# Patient Record
Sex: Male | Born: 1985 | Race: Black or African American | Hispanic: No | Marital: Single | Smoking: Current every day smoker
Health system: Southern US, Community
[De-identification: ages and names within clinical notes are randomized; demographics above are authoritative.]

---

## 2020-07-15 ENCOUNTER — Other Ambulatory Visit: Payer: Self-pay

## 2020-07-15 ENCOUNTER — Emergency Department (HOSPITAL_COMMUNITY)
Admission: EM | Admit: 2020-07-15 | Discharge: 2020-07-16 | Disposition: A | Discharge status: Home / Self Care | Payer: Worker's Compensation | Attending: Emergency Medicine | Admitting: Emergency Medicine

## 2020-07-15 ENCOUNTER — Emergency Department (HOSPITAL_COMMUNITY): Payer: Self-pay

## 2020-07-15 DIAGNOSIS — W010XXA Fall on same level from slipping, tripping and stumbling without subsequent striking against object, initial encounter: Secondary | ICD-10-CM | POA: Diagnosis not present

## 2020-07-15 DIAGNOSIS — M25521 Pain in right elbow: Secondary | ICD-10-CM | POA: Diagnosis not present

## 2020-07-15 DIAGNOSIS — M79604 Pain in right leg: Secondary | ICD-10-CM

## 2020-07-15 DIAGNOSIS — S92415A Nondisplaced fracture of proximal phalanx of left great toe, initial encounter for closed fracture: Secondary | ICD-10-CM | POA: Diagnosis not present

## 2020-07-15 DIAGNOSIS — S7011XA Contusion of right thigh, initial encounter: Secondary | ICD-10-CM | POA: Diagnosis not present

## 2020-07-15 DIAGNOSIS — W19XXXA Unspecified fall, initial encounter: Secondary | ICD-10-CM

## 2020-07-15 DIAGNOSIS — S99922A Unspecified injury of left foot, initial encounter: Secondary | ICD-10-CM | POA: Diagnosis present

## 2020-07-15 MED ORDER — CYCLOBENZAPRINE HCL 10 MG PO TABS
10.0000 mg | ORAL_TABLET | Freq: Once | ORAL | Status: AC
  Administered 2020-07-16: 10 mg via ORAL
  Filled 2020-07-15: qty 1

## 2020-07-15 MED ORDER — KETOROLAC TROMETHAMINE 60 MG/2ML IM SOLN
60.0000 mg | Freq: Once | INTRAMUSCULAR | Status: AC
  Administered 2020-07-16: 60 mg via INTRAMUSCULAR
  Filled 2020-07-15: qty 2
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
## 2020-07-15 NOTE — ED Notes (Signed)
Pt to xray

## 2020-07-16 ENCOUNTER — Emergency Department (HOSPITAL_COMMUNITY): Payer: Worker's Compensation

## 2020-07-16 MED ORDER — HYDROCODONE-ACETAMINOPHEN 5-325 MG PO TABS: 1.0000 | ORAL_TABLET | Freq: Four times a day (QID) | ORAL | 0 refills | Status: AC | PRN

## 2020-07-16 MED ORDER — CYCLOBENZAPRINE HCL 10 MG PO TABS: 10.0000 mg | ORAL_TABLET | Freq: Two times a day (BID) | ORAL | 0 refills | Status: AC | PRN
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
## 2020-07-16 NOTE — ED Notes (Signed)
This RN called the ortho tech for a CAM boot and crutches

## 2020-07-16 NOTE — ED Notes (Signed)
Patient verbalizes understanding of discharge instructions. Opportunity for questioning and answers were provided. Armband removed by staff, pt discharged from ED via wheelchair.  

## 2020-07-16 NOTE — Progress Notes (Signed)
Orthopedic Tech Progress Note Patient Details:  Mike Hood Mar 15, 1986 [id number]    Post Interventions Patient Tolerated: Well Instructions Provided: Adjustment of device,Care of device,Poper ambulation with device    Ortho Devices Type of Ortho Device: Crutches,CAM walker Ortho Device/Splint Location: Left Lower Extremity Ortho Device/Splint Interventions: Ordered,Application,Adjustment   Post Interventions Patient Tolerated: Well Instructions Provided: Adjustment of device,Care of device,Poper ambulation with device   [name] [name] 07/16/2020, 1:22 AM

## 2020-07-16 NOTE — ED Notes (Signed)
Pt back from x-ray.

## 2020-08-05 ENCOUNTER — Ambulatory Visit: Payer: Self-pay

## 2020-08-05 ENCOUNTER — Other Ambulatory Visit: Payer: Self-pay

## 2020-08-05 ENCOUNTER — Encounter: Payer: Self-pay | Admitting: Orthopaedic Surgery

## 2020-08-05 ENCOUNTER — Ambulatory Visit (INDEPENDENT_AMBULATORY_CARE_PROVIDER_SITE_OTHER): Payer: Worker's Compensation | Admitting: Orthopaedic Surgery

## 2020-08-05 DIAGNOSIS — S92412A Displaced fracture of proximal phalanx of left great toe, initial encounter for closed fracture: Secondary | ICD-10-CM

## 2020-08-05 DIAGNOSIS — M79675 Pain in left toe(s): Secondary | ICD-10-CM

## 2020-08-05 DIAGNOSIS — S92912A Unspecified fracture of left toe(s), initial encounter for closed fracture: Secondary | ICD-10-CM | POA: Insufficient documentation
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
## 2020-08-15 ENCOUNTER — Encounter: Payer: Self-pay | Admitting: Orthopaedic Surgery

## 2020-08-15 ENCOUNTER — Ambulatory Visit (INDEPENDENT_AMBULATORY_CARE_PROVIDER_SITE_OTHER): Payer: Worker's Compensation | Admitting: Orthopaedic Surgery

## 2020-08-15 VITALS — BP 151/96 | HR 83 | Ht 69.0 in | Wt 270.0 lb

## 2020-08-15 DIAGNOSIS — S92412A Displaced fracture of proximal phalanx of left great toe, initial encounter for closed fracture: Secondary | ICD-10-CM

## 2020-08-15 NOTE — Progress Notes (Signed)
Office Visit Note   Patient: Mike Hood           Date of Birth: 05-14-1986           MRN: [id number] Visit Date: 08/15/2020              Requested by: No referring provider defined for this encounter. PCP: Pcp, No   Assessment & Plan: Visit Diagnoses:  1. Closed displaced fracture of proximal phalanx of left great toe, initial encounter     Plan: Work note starting work Monday, 08/18/2020 with full days which are normal 10-hour shift.  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  Due to his toe fracture he is not able to ambulate at normal speed to do normal number of deliveries.  I plan to recheck him in 4 weeks.  Impairment rating would be in line with his intra-articular toe fracture to the great toe using [employer] act guidelines.  Follow-Up Instructions: Return in about 4 weeks (around 09/12/2020).   Orders:  No orders of the defined types were placed in this encounter.  No orders of the defined types were placed in this encounter.     Procedures: No procedures performed   Clinical Data: No additional findings.   Subjective: Chief Complaint  Patient presents with   Left Foot - Fracture, Follow-up    OTJI 07/17/2020    HPI 35 year old male returns for on-the-job injury toe fracture intra-articular 07/17/2020.  He is out of boot and into a tennis shoe that has a more rigid sole.  He is amatory without limp and work slip given for work resumption on 08/18/2020.    Review of Systems systems updated unchanged.   Objective: Vital Signs: BP (!) 151/96    Pulse 83    Ht 5\' 9"  (1.753 m)    Wt 270 lb (122.5 kg)    BMI 39.87 kg/m   Physical Exam Constitutional:      Appearance: He is well-developed and well-nourished.  HENT:     Head: Normocephalic and atraumatic.  Eyes:     Extraocular Movements:  EOM normal.     Pupils: Pupils are equal, round, and reactive to light.  Neck:     Thyroid: No thyromegaly.     Trachea: No tracheal deviation.  Cardiovascular:     Rate and Rhythm: Normal rate.  Pulmonary:     Effort: Pulmonary effort is normal.     Breath sounds: No wheezing.  Abdominal:     General: Bowel sounds are normal.     Palpations: Abdomen is soft.  Skin:    General: Skin is warm and dry.     Capillary Refill: Capillary refill takes less than 2 seconds.  Neurological:     Mental Status: He is alert and oriented to person, place, and time.  Psychiatric:        Mood and Affect: Mood and affect normal.        Behavior: Behavior normal.        Thought Content: Thought content normal.        Judgment: Judgment normal.     Ortho Exam patient is ambulatory without a limp.  Normal stride length.  Good dorsiflexion of the toe.  Increased pain with IP flexion. Specialty Comments:  No specialty comments available.  Imaging: No results  found.   PMFS History: Patient Active Problem List   Diagnosis Date Noted   Toe fracture, left 08/05/2020   No past medical history on file.  No family history on file.   Social History   Occupational History   Not on file  Tobacco Use   Smoking status: Current Every Day Smoker   Smokeless tobacco: Never Used  Substance and Sexual Activity   Alcohol use: Not Currently   Drug use: Not on file   Sexual activity: Not on file

## 2020-09-12 ENCOUNTER — Ambulatory Visit (INDEPENDENT_AMBULATORY_CARE_PROVIDER_SITE_OTHER): Payer: Worker's Compensation | Admitting: Orthopaedic Surgery

## 2020-09-12 ENCOUNTER — Encounter: Payer: Self-pay | Admitting: Orthopaedic Surgery

## 2020-09-12 VITALS — BP 114/74 | HR 84 | Ht 69.0 in | Wt 270.0 lb

## 2020-09-12 DIAGNOSIS — S92412D Displaced fracture of proximal phalanx of left great toe, subsequent encounter for fracture with routine healing: Secondary | ICD-10-CM | POA: Diagnosis not present

## 2020-09-12 NOTE — Progress Notes (Signed)
Office Visit Note   Patient: Mike Hood           Date of Birth: [birth year]           MRN: [id number] Visit Date: 09/12/2020              Requested by: No referring provider defined for this encounter. PCP: Pcp, No   Assessment & Plan: Visit Diagnoses: No diagnosis found.  Plan: Continue previously outlined plan for gradual resumption of full duty.  I will see him in 1 month and will assign him an impairment rating.  Preliminary impairment for the intra-articular great toe fracture involving the proximal phalanx would be 10% of the left great toe.  He will return in 1 month for final impairment rating.  Follow-Up Instructions: No follow-ups on file.   Orders:  No orders of the defined types were placed in this encounter.  No orders of the defined types were placed in this encounter.     Procedures: No procedures performed   Clinical Data: No additional findings.   Subjective: Chief Complaint  Patient presents with  . Left Foot - Follow-up, Fracture    OTJI 07/17/2020    HPI 35 year old male returns for follow-up of left great toe fracture.  4-week follow-up.  Patient resume work on 08/18/2020 half days and is gradually ramping up as previously outlined in his note when I saw him on 08/15/2020.  Original date of injury was 07/17/2020 2 days before Christmas.  Review of Systems 14 point system updated unchanged.   Objective: Vital Signs: BP 114/74   Pulse 84   Ht 5\' 9"  (1.753 m)   Wt 270 lb (122.5 kg)   BMI 39.87 kg/m   Physical Exam Constitutional:      Appearance: He is well-developed and well-nourished.  HENT:     Head: Normocephalic and atraumatic.  Eyes:     Extraocular Movements: EOM normal.     Pupils: Pupils are equal, round, and reactive to light.  Neck:     Thyroid: No thyromegaly.     Trachea: No tracheal deviation.  Cardiovascular:     Rate and Rhythm: Normal rate.  Pulmonary:     Effort: Pulmonary effort is normal.     Breath sounds:  No wheezing.  Abdominal:     General: Bowel sounds are normal.     Palpations: Abdomen is soft.  Skin:    General: Skin is warm and dry.     Capillary Refill: Capillary refill takes less than 2 seconds.  Neurological:     Mental Status: He is alert and oriented to person, place, and time.  Psychiatric:        Mood and Affect: Mood and affect normal.        Behavior: Behavior normal.        Thought Content: Thought content normal.        Judgment: Judgment normal.     Ortho Exam patient is amatory normal heel toe gait without limping.  Patient still has some mild left great toe swelling  Specialty Comments:  No specialty comments available.  Imaging: No results found.   PMFS History: Patient Active Problem List   Diagnosis Date Noted  . Toe fracture, left 08/05/2020   History reviewed. No pertinent past medical history.  History reviewed. No pertinent family history.  History reviewed. No pertinent surgical history. Social History   Occupational History  . Not on file  Tobacco Use  . Smoking status: Current Every  Day Smoker  . Smokeless tobacco: Never Used  Substance and Sexual Activity  . Alcohol use: Not Currently  . Drug use: Not on file  . Sexual activity: Not on file

## 2020-10-10 ENCOUNTER — Ambulatory Visit: Payer: Self-pay | Admitting: Orthopaedic Surgery

## 2022-09-27 IMAGING — DX DG TOE GREAT 2+V*L*
3 series · 3 of 3 positions shown · non-contrast
Comparison: None.

CLINICAL DATA: Fall.

EXAM:
LEFT GREAT TOE

[toe ap]
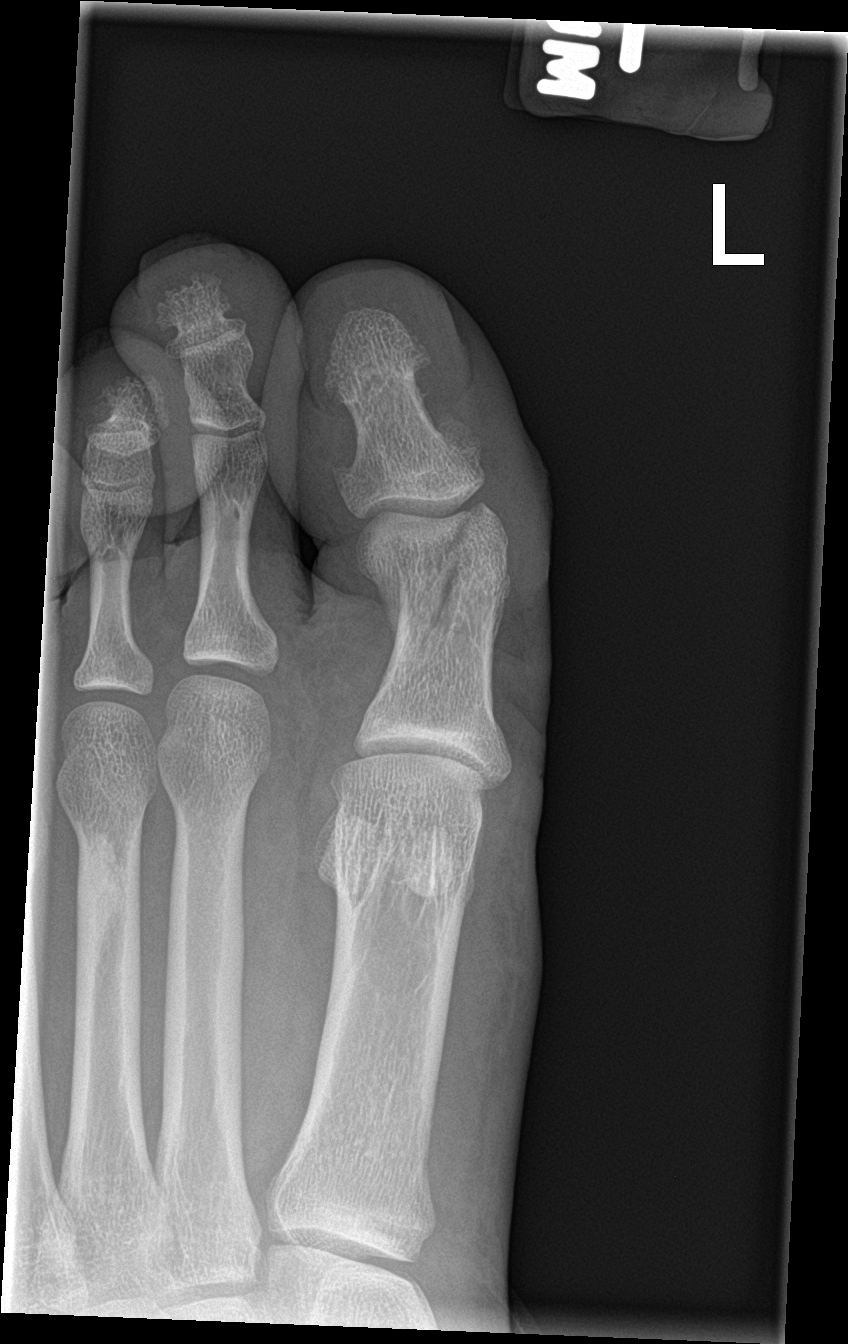

[toe obl]
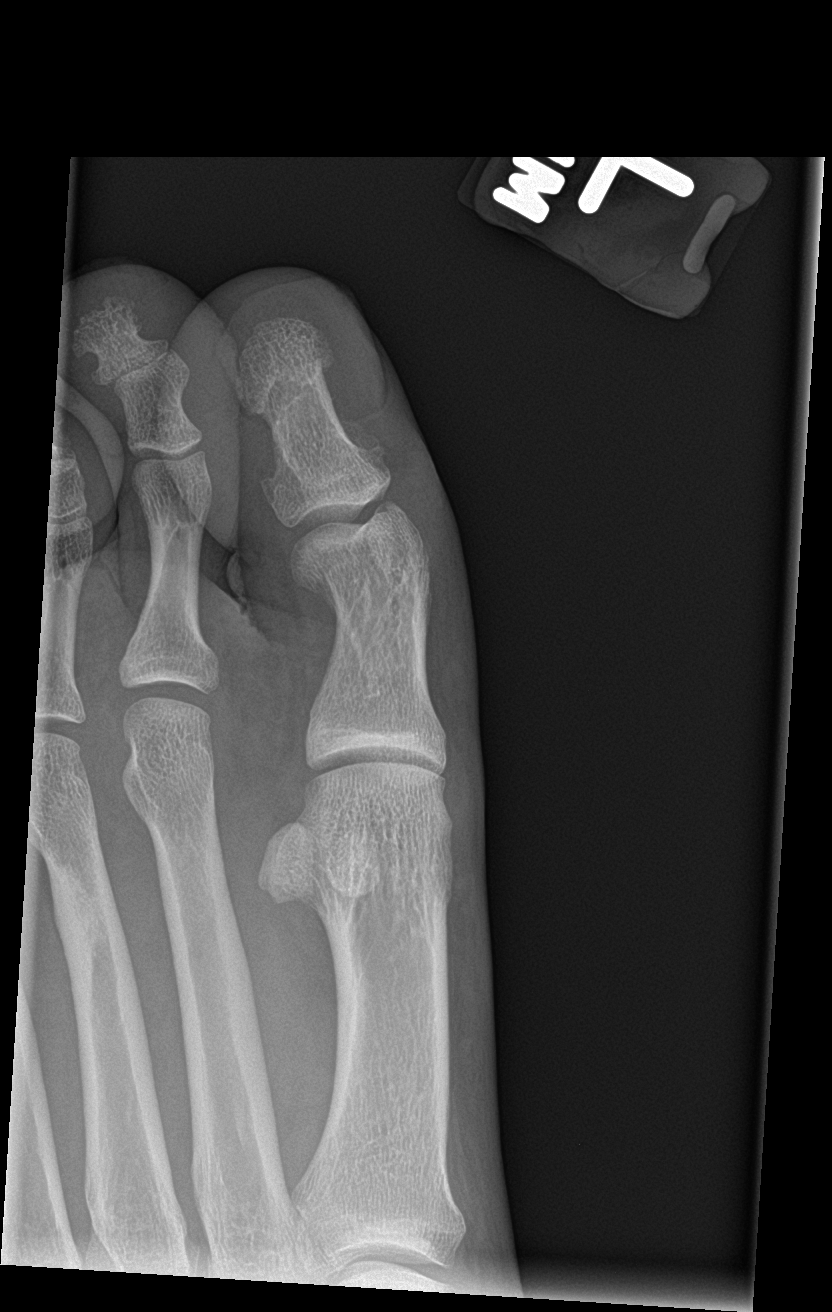

[toe lat]
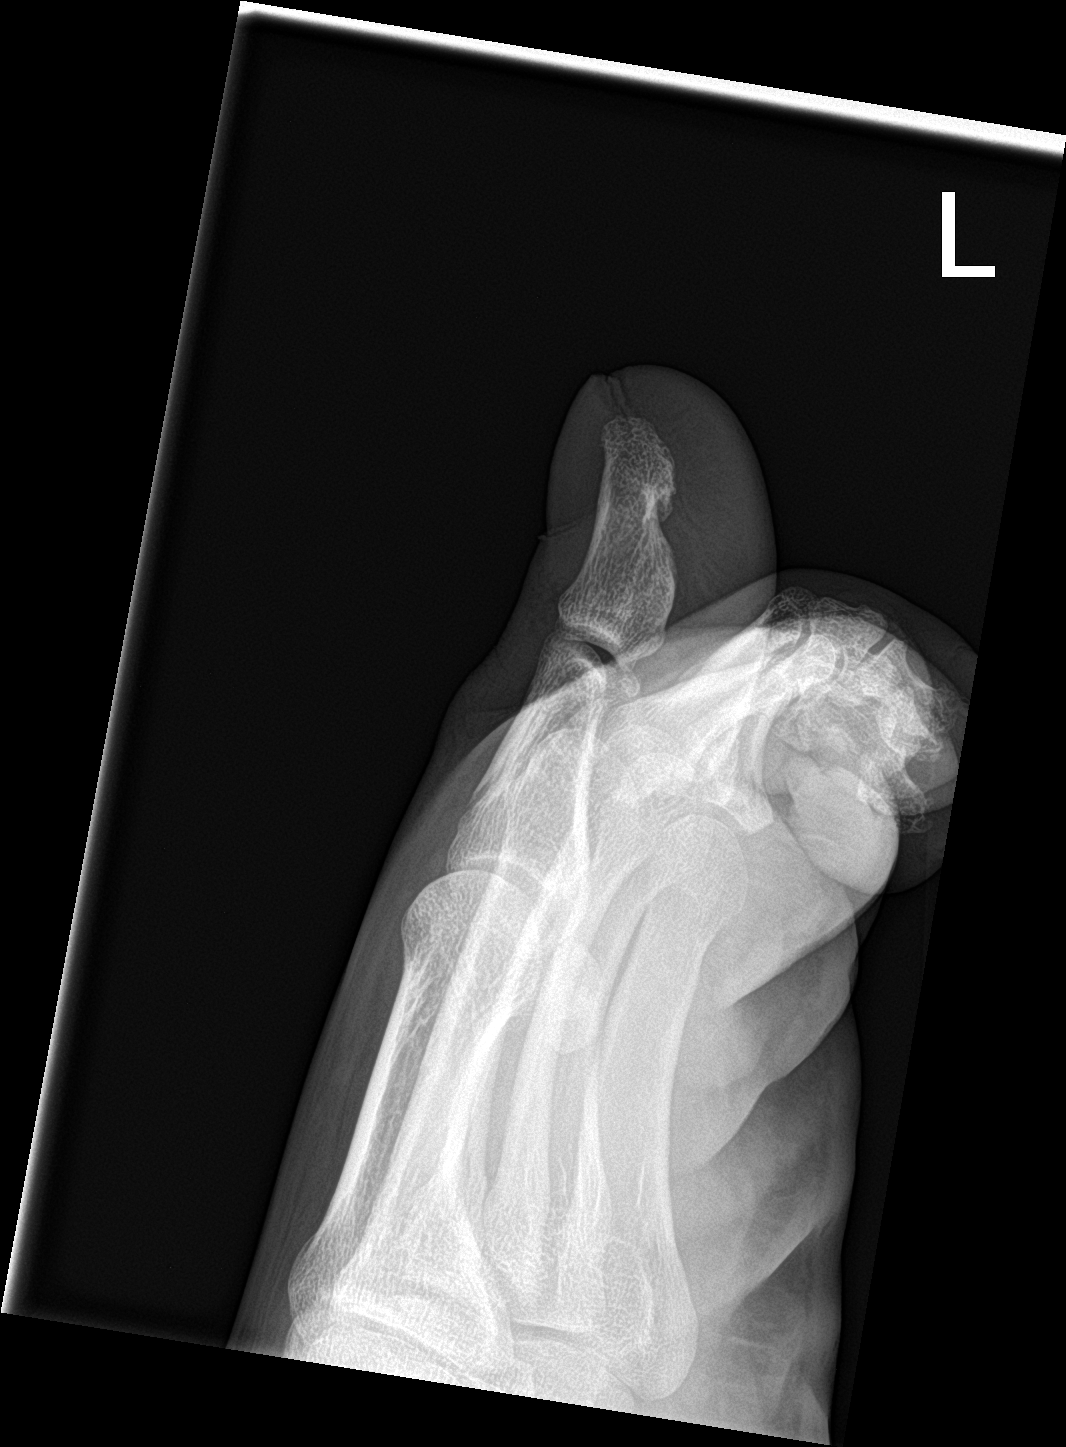

[3 of 3 positions shown; findings below may reference images not displayed]

FINDINGS: Acute nondisplaced fracture of the first proximal phalanx extending
into the first IP joint. No additional fracture. No dislocation.
Joint spaces are preserved. Bone mineralization is normal. Soft
tissues are unremarkable.
IMPRESSION: 1. Acute nondisplaced intra-articular fracture of the first proximal
phalanx.
